# Patient Record
Sex: Female | Born: 1960 | Race: White | Hispanic: No | Marital: Married | Smoking: Never smoker
Health system: Southern US, Community
[De-identification: ages and names within clinical notes are randomized; demographics above are authoritative.]

## PROBLEM LIST (undated history)

## (undated) HISTORY — PX: NO PAST SURGERIES: SHX2092

---

## 2004-08-17 ENCOUNTER — Emergency Department: Payer: Self-pay | Admitting: Emergency Medicine

## 2008-03-24 ENCOUNTER — Ambulatory Visit: Payer: Self-pay | Admitting: Family Medicine

## 2014-05-20 ENCOUNTER — Other Ambulatory Visit: Payer: Self-pay | Admitting: Family Medicine

## 2014-05-20 DIAGNOSIS — Z1239 Encounter for other screening for malignant neoplasm of breast: Secondary | ICD-10-CM

## 2014-05-29 ENCOUNTER — Ambulatory Visit: Admission: RE | Admit: 2014-05-29 | Payer: Self-pay | Source: Ambulatory Visit

## 2014-06-04 ENCOUNTER — Ambulatory Visit
Admission: RE | Admit: 2014-06-04 | Discharge: 2014-06-04 | Disposition: A | Discharge status: Home / Self Care | Payer: Medicaid Other | Source: Ambulatory Visit | Attending: Family Medicine | Admitting: Family Medicine

## 2014-06-04 DIAGNOSIS — Z1239 Encounter for other screening for malignant neoplasm of breast: Secondary | ICD-10-CM

## 2014-06-04 DIAGNOSIS — Z1231 Encounter for screening mammogram for malignant neoplasm of breast: Secondary | ICD-10-CM | POA: Insufficient documentation

## 2014-06-09 ENCOUNTER — Other Ambulatory Visit: Payer: Self-pay | Admitting: Family Medicine

## 2014-06-09 DIAGNOSIS — N6489 Other specified disorders of breast: Secondary | ICD-10-CM

## 2014-06-09 DIAGNOSIS — R928 Other abnormal and inconclusive findings on diagnostic imaging of breast: Secondary | ICD-10-CM

## 2014-06-09 DIAGNOSIS — N63 Unspecified lump in unspecified breast: Secondary | ICD-10-CM

## 2014-06-13 ENCOUNTER — Ambulatory Visit
Admission: RE | Admit: 2014-06-13 | Discharge: 2014-06-13 | Disposition: A | Discharge status: Home / Self Care | Payer: Medicaid Other | Source: Ambulatory Visit | Attending: Family Medicine | Admitting: Family Medicine

## 2014-06-13 DIAGNOSIS — N6489 Other specified disorders of breast: Secondary | ICD-10-CM | POA: Diagnosis present

## 2014-06-13 DIAGNOSIS — N63 Unspecified lump in unspecified breast: Secondary | ICD-10-CM

## 2014-06-13 DIAGNOSIS — R928 Other abnormal and inconclusive findings on diagnostic imaging of breast: Secondary | ICD-10-CM

## 2017-01-13 IMAGING — US US BREAST*L* LIMITED INC AXILLA
1 series · 4 of 4 positions shown · non-contrast
Comparison: None.

ADDENDUM:
Addendum by Dr. Dieula Dimas on 06/25/2014. Outside prior
mammograms from [facility]HOSP[facility] have been obtained and
there is no significant change in the oval circumscribed and partly
obscured mass in the left breast 2 o'clock location. Given probably
benign findings on recent examination and long-term interval
stability, return to screening mammography in 1 year is recommended
without need for specific further surveillance unless there is any
clinical change.

Recommendation: Screening mammogram in one year.(Code:Z3-Z-NEX)
BI-RADS category: 2: Benign.
CLINICAL DATA: Patient recalled from screening for left breast mass
and right breast asymmetry.
EXAM:
DIGITAL DIAGNOSTIC BILATERAL MAMMOGRAM WITH 3D TOMOSYNTHESIS WITH
CAD
ULTRASOUND BILATERAL BREAST

[Series 1: us breast*left* limited inc axilla · 0.08mm/px · 4 of 4 slices shown]
[im 1/4]
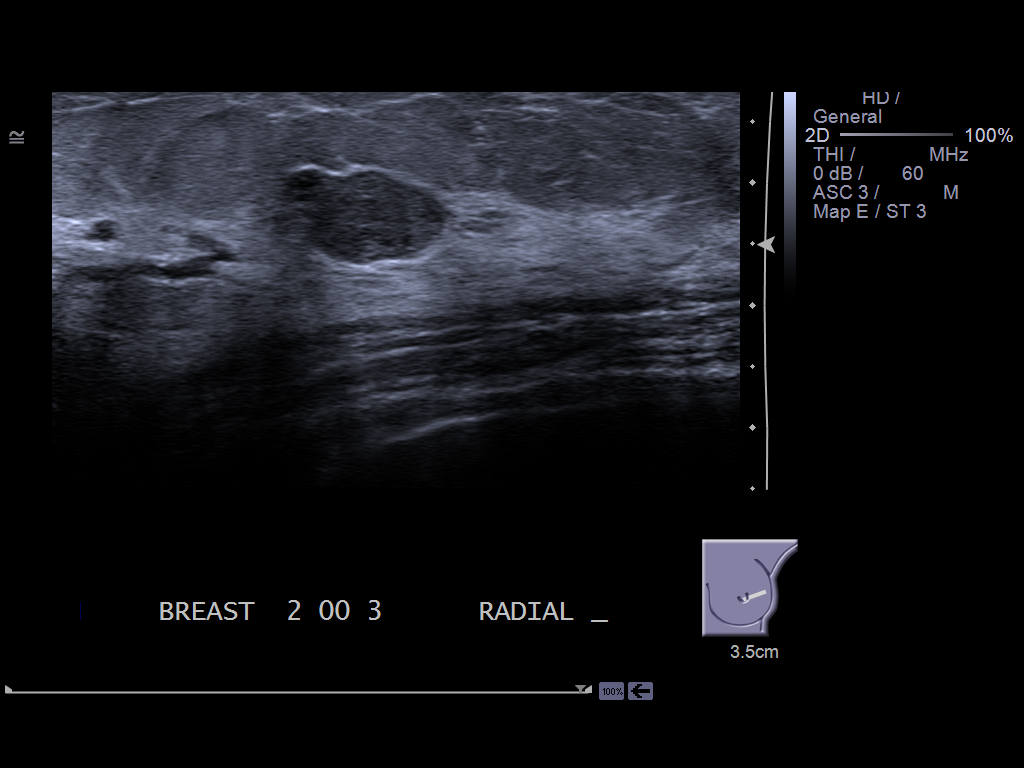
[im 2/4]
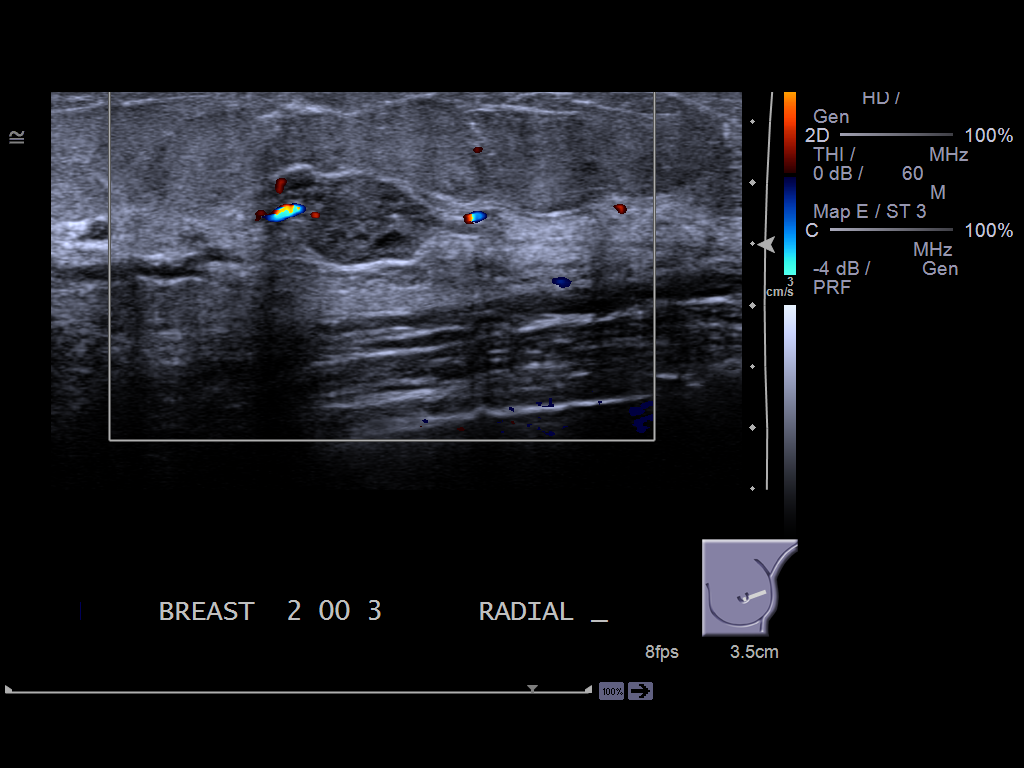
[im 3/4]
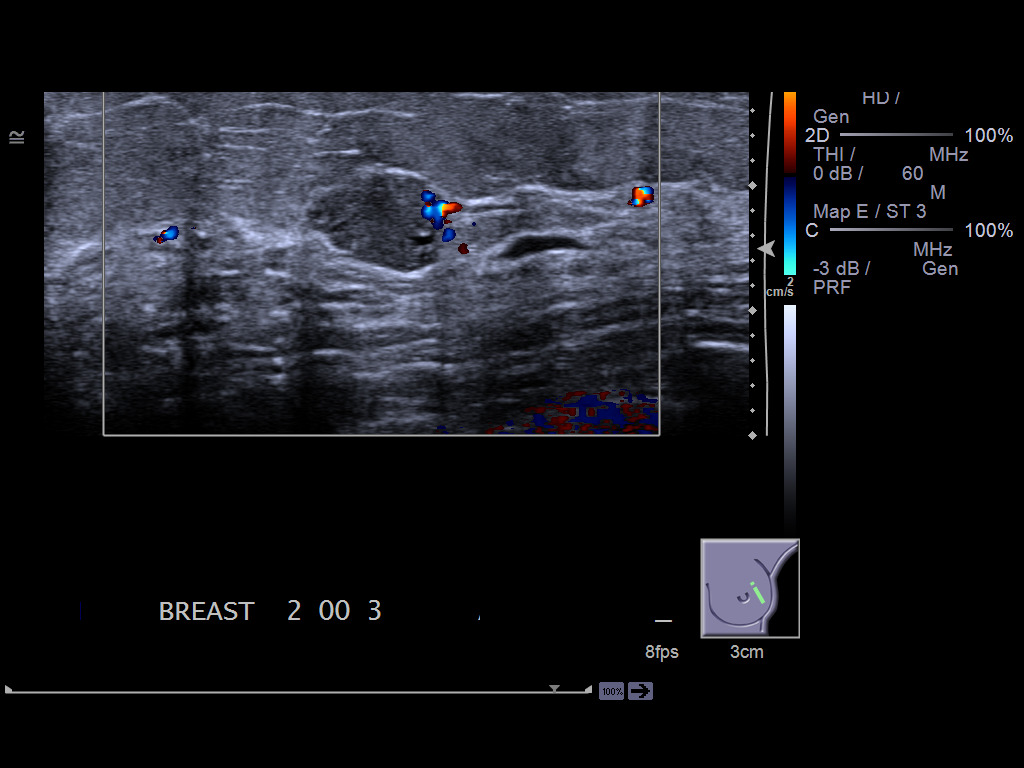
[im 4/4]
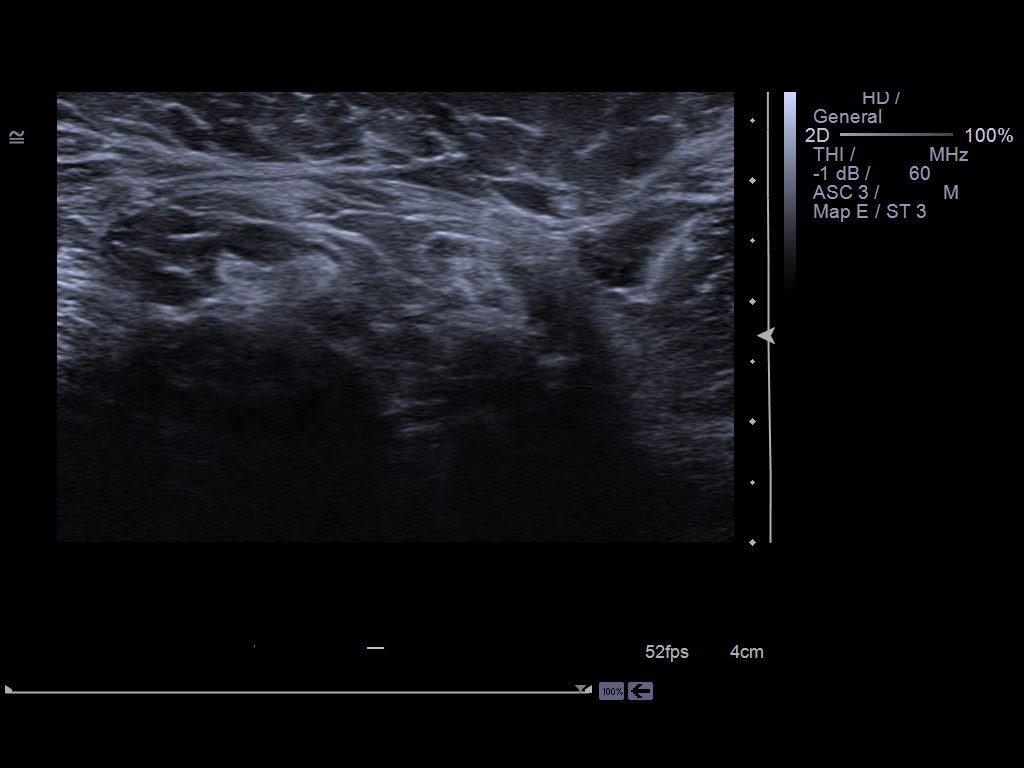

[4 of 4 positions shown; findings below may reference images not displayed]

ACR Breast Density Category c: The breast tissue is heterogeneously
dense, which may obscure small masses.
FINDINGS: Within the upper-outer left breast middle depth there is a
persistent oval circumscribed 1.4 cm mass further evaluated with
tomosynthesis images. Questioned asymmetry within the inferior
aspect of the right breast predominately resolved, most suggestive
of dense fibroglandular breast tissue.

Mammographic images were processed with CAD.

On physical exam, I palpate no discrete mass within the upper-outer
left breast. I palpate no discrete mass within the inferior right
breast.

Targeted ultrasound is performed, showing a 1.6 x 0.7 x 1.3 cm oval
circumscribed hypoechoic mass within left breast 2 o'clock position
3 cm from the nipple. No left axillary lymphadenopathy.

Within the inferior right breast near the 6 o'clock position dense
tissue is visualized without suspicious mass.
IMPRESSION: Hypoechoic left breast mass, potentially representing a
fibroadenoma. Malignancy cannot be excluded.

RECOMMENDATION:
Patient reports prior mammogram evaluation in [place]. An attempt
will be made to obtain these prior mammograms as this will allow for
determination of stability or interval change of the left breast
mass. If the priors are obtained, they will be compared with current
evaluation and a recommendation will be made.

If no prior evaluations are able to be obtained in the next 2 weeks
of the left breast, ultrasound-guided core needle biopsy of the left
breast mass is recommended. This was discussed with the patient.

I have discussed the findings and recommendations with the patient.
Results were also provided in writing at the conclusion of the
visit. If applicable, a reminder letter will be sent to the patient
regarding the next appointment.

BI-RADS CATEGORY  0: Incomplete. Need additional imaging evaluation
and/or prior mammograms for comparison.

## 2018-01-01 ENCOUNTER — Ambulatory Visit
Admission: EM | Admit: 2018-01-01 | Discharge: 2018-01-01 | Disposition: A | Discharge status: Home / Self Care | Payer: Self-pay | Attending: Family Medicine | Admitting: Family Medicine

## 2018-01-01 ENCOUNTER — Encounter: Payer: Self-pay | Admitting: Emergency Medicine

## 2018-01-01 ENCOUNTER — Other Ambulatory Visit: Payer: Self-pay

## 2018-01-01 DIAGNOSIS — R05 Cough: Secondary | ICD-10-CM

## 2018-01-01 DIAGNOSIS — J111 Influenza due to unidentified influenza virus with other respiratory manifestations: Secondary | ICD-10-CM

## 2018-01-01 DIAGNOSIS — R059 Cough, unspecified: Secondary | ICD-10-CM

## 2018-01-01 LAB — RAPID INFLUENZA A&B ANTIGENS (ARMC ONLY)
INFLUENZA A (ARMC): POSITIVE — AB
INFLUENZA B (ARMC): NEGATIVE

## 2018-01-01 MED ORDER — PROMETHAZINE-DM 6.25-15 MG/5ML PO SYRP: 5.0000 mL | ORAL_SOLUTION | Freq: Four times a day (QID) | ORAL | 0 refills | Status: AC | PRN

## 2018-01-01 NOTE — ED Provider Notes (Signed)
[facility]    CSN: [id number] Arrival date & time: 01/01/18  1509     History   Chief Complaint Chief Complaint  Patient presents with  . Generalized Body Aches  . Cough    HPI [name] is a 57 y.o. female. Patient presents with cough, fatigue and body aches x 5 days. Admits to temps up to 103 degrees at onset. Has not measured a temp in 3 days. Patient has been taking Motrin and Tylenol. States she feels better today than she did a few days ago. Denies any other concerns today.  HPI  History reviewed. No pertinent past medical history.  There are no active problems to display for this patient.   Past Surgical History:  Procedure Laterality Date  . NO PAST SURGERIES      OB History   No obstetric history on file.      Home Medications    Prior to Admission medications   Medication Sig Start Date End Date Taking? Authorizing Provider  promethazine-dextromethorphan (PROMETHAZINE-DM) 6.25-15 MG/5ML syrup Take 5 mLs by mouth 4 (four) times daily as needed for up to 7 days for cough. 01/01/18 01/08/18  Shirlee LatchEaves, Lakecia Deschamps B, PA-C    Family History Family History  Problem Relation Age of Onset  . Cancer Mother   . Dementia Father     Social History Social History   Tobacco Use  . Smoking status: Never Smoker  . Smokeless tobacco: Never Used  Substance Use Topics  . Alcohol use: Never    Frequency: Never  . Drug use: Never     Allergies   Patient has no known allergies.   Review of Systems Review of Systems  Constitutional: Positive for chills, fatigue and fever.  HENT: Positive for congestion and rhinorrhea. Negative for ear pain, sinus pressure, sinus pain and sore throat.   Respiratory: Positive for cough. Negative for chest tightness and shortness of breath.   Cardiovascular: Negative for chest pain.  Gastrointestinal: Negative for abdominal pain, nausea and vomiting.  Musculoskeletal: Positive for arthralgias and myalgias.  Skin:  Negative for color change and rash.  Neurological: Positive for headaches. Negative for dizziness, weakness and light-headedness.  Hematological: Positive for adenopathy.     Physical Exam Triage Vital Signs ED Triage Vitals  Enc Vitals Group     BP 01/01/18 1706 (!) 153/81     Pulse Rate 01/01/18 1706 78     Resp 01/01/18 1706 18     Temp 01/01/18 1706 98.5 F (36.9 C)     Temp Source 01/01/18 1706 Oral     SpO2 01/01/18 1706 100 %     Weight 01/01/18 1703 136 lb (61.7 kg)     Height 01/01/18 1703 5\' 2"  (1.575 m)     Head Circumference --      Peak Flow --      Pain Score 01/01/18 1703 0     Pain Loc --      Pain Edu? --      Excl. in GC? --    No data found.  Updated Vital Signs BP (!) 153/81 (BP Location: Left Arm)   Pulse 78   Temp 98.5 F (36.9 C) (Oral)   Resp 18   Ht 5\' 2"  (1.575 m)   Wt 136 lb (61.7 kg)   SpO2 100%   BMI 24.87 kg/m      Physical Exam Vitals signs and nursing note reviewed.  Constitutional:      General: She  is not in acute distress.    Appearance: Normal appearance. She is obese. She is ill-appearing. She is not toxic-appearing or diaphoretic.  HENT:     Head: Normocephalic and atraumatic.     Right Ear: Tympanic membrane, ear canal and external ear normal.     Left Ear: Tympanic membrane, ear canal and external ear normal.     Nose: Congestion and rhinorrhea (trace clear drainage) present.     Mouth/Throat:     Mouth: Mucous membranes are moist.     Pharynx: No oropharyngeal exudate.  Eyes:     General: No scleral icterus.       Right eye: No discharge.     Conjunctiva/sclera: Conjunctivae normal.  Neck:     Musculoskeletal: Neck supple. No muscular tenderness.  Cardiovascular:     Rate and Rhythm: Normal rate and regular rhythm.     Heart sounds: Normal heart sounds.  Pulmonary:     Effort: Pulmonary effort is normal. No respiratory distress.     Breath sounds: Normal breath sounds. No wheezing, rhonchi or rales.    Lymphadenopathy:     Cervical: Cervical adenopathy present.  Skin:    General: Skin is warm and dry.     Findings: No erythema or rash.  Neurological:     General: No focal deficit present.     Mental Status: She is alert and oriented to person, place, and time.     Gait: Gait normal.  Psychiatric:        Mood and Affect: Mood normal.        Behavior: Behavior normal.        Thought Content: Thought content normal.      UC Treatments / Results  Labs (all labs ordered are listed, but only abnormal results are displayed) Labs Reviewed  RAPID INFLUENZA A&B ANTIGENS ([facility] ONLY) - Abnormal; Notable for the following components:      Result Value   Influenza A ([facility]) POSITIVE (*)    All other components within normal limits    EKG None  Radiology No results found.  Procedures Procedures (including critical care time)  Medications Ordered in UC Medications - No data to display  Initial Impression / Assessment and Plan / UC Course  I have reviewed the triage vital signs and the nursing notes.  Pertinent labs & imaging results that were available during my care of the patient were reviewed by me and considered in my medical decision making (see chart for details).    Final Clinical Impressions(s) / UC Diagnoses   Final diagnoses:  Influenza  Cough     Discharge Instructions     FLU: Your flu test was positive today. You are out of the window for treatment with Tamiflu which is most helpful if started within the first 48 hours. Your fever has resolved and you appear to be on the mend at this time. Stressed the need to increase rest and fluid/electrolyte intake . Use medications as directed including cough medications. If breathing becomes a concern follow back up with [place] or go to the ER. Avoid others and do not return to work/school too soon in order to prevent others from becoming infected. Most get better within 1-2 wks. Call PCP if trouble breathing or are short of  breath, feel pain or pressure in your chest or abdomen, get dizzy, feel confused, or have vomiting .     ED Prescriptions    Medication Sig Dispense Auth. Provider   promethazine-dextromethorphan (PROMETHAZINE-DM)  6.25-15 MG/5ML syrup Take 5 mLs by mouth 4 (four) times daily as needed for up to 7 days for cough. 118 mL Eusebio FriendlyEaves, Tarsha Blando B, PA-C     Controlled Substance Prescriptions [place] Controlled Substance Registry consulted? Not Applicable   Gareth Morganaves, Imara Standiford B, PA-C 01/03/18 2001

## 2018-01-01 NOTE — ED Triage Notes (Signed)
Pt c/o cough, body aches, chills, fever and sweats. Started 5 days ago.

## 2018-01-01 NOTE — Discharge Instructions (Signed)
FLU: Your flu test was positive today. You are out of the window for treatment with Tamiflu which is most helpful if started within the first 48 hours. Your fever has resolved and you appear to be on the mend at this time. Stressed the need to increase rest and fluid/electrolyte intake . Use medications as directed including cough medications. If breathing becomes a concern follow back up with us or go to the ER. Avoid others and do not return to work/school too soon in order to prevent others from becoming infected. Most get better within 1-2 wks. Call PCP if trouble breathing or are short of breath, feel pain or pressure in your chest or abdomen, get dizzy, feel confused, or have vomiting .

## 2020-09-09 ENCOUNTER — Ambulatory Visit: Payer: Medicaid Other | Attending: Oncology

## 2021-12-01 ENCOUNTER — Encounter: Payer: Self-pay | Admitting: Family Medicine

## 2021-12-29 ENCOUNTER — Other Ambulatory Visit: Payer: Self-pay

## 2021-12-29 DIAGNOSIS — Z1231 Encounter for screening mammogram for malignant neoplasm of breast: Secondary | ICD-10-CM

## 2022-02-01 ENCOUNTER — Ambulatory Visit
Admission: RE | Admit: 2022-02-01 | Discharge: 2022-02-01 | Disposition: A | Discharge status: Home / Self Care | Payer: Medicaid Other | Source: Ambulatory Visit | Attending: Obstetrics and Gynecology | Admitting: Obstetrics and Gynecology

## 2022-02-01 ENCOUNTER — Ambulatory Visit: Payer: Medicaid Other | Attending: Hematology and Oncology | Admitting: Hematology and Oncology

## 2022-02-01 VITALS — BP 136/63 | Wt 122.3 lb

## 2022-02-01 DIAGNOSIS — Z1231 Encounter for screening mammogram for malignant neoplasm of breast: Secondary | ICD-10-CM | POA: Insufficient documentation

## 2022-02-01 NOTE — Progress Notes (Signed)
Ms. Kari Murray is a 62 y.o. female who presents to [facility] clinic today with no complaints.    Pap Smear: Pap not smear completed today. Last Pap smear was 2022 at [facility] clinic and was normal. Per patient has no history of an abnormal Pap smear. Last Pap smear result is available in Epic.   Physical exam: Breasts Breasts symmetrical. No skin abnormalities bilateral breasts. No nipple retraction bilateral breasts. No nipple discharge bilateral breasts. No lymphadenopathy. No lumps palpated bilateral breasts.       Pelvic/Bimanual Pap is not indicated today    Smoking History: Patient has never smoked and was not referred to quit line.    Patient Navigation: Patient education provided. Access to services provided for patient through [facility] program. No interpreter provided. No transportation provided   Colorectal Cancer Screening: Per patient has never had colonoscopy completed No complaints today. FIT test negative per Princella Ion.   Breast and Cervical Cancer Risk Assessment: Patient does not have family history of breast cancer, known genetic mutations, or radiation treatment to the chest before age 6. Patient does not have history of cervical dysplasia, immunocompromised, or DES exposure in-utero.  Risk Assessment   No risk assessment data     A: [facility] exam without pap smear No complaint with benign exam.  P: [facility] a [facility]. Appointment scheduled 02/01/22.  Dayton Scrape A, NP 02/01/2022 1:30 PM

## 2022-02-01 NOTE — Patient Instructions (Signed)
Taught Dorthula Nettles about breast self awareness and gave educational materials to take home. Patient did not need a Pap smear today due to last Pap smear was in 2022 per patient.  Let her know [facility] will cover Pap smears every 5 years unless has a history of abnormal Pap smears. [facility]erred [facility] to the [facility] for screening mamm[facility]. Appointment scheduled for 02/01/22. Patient aware of appointment and will be there. Let patient know will follow up with her within the next couple weeks with results. Mercy Moore Lukic verbalized understanding.  Melodye Ped, NP 1:31 PM

## 2023-01-25 LAB — COLOGUARD: COLOGUARD: NEGATIVE

## 2023-09-21 ENCOUNTER — Ambulatory Visit: Payer: Self-pay
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
## 2023-10-03 ENCOUNTER — Encounter: Payer: Self-pay | Admitting: Family Medicine

## 2023-10-03 ENCOUNTER — Ambulatory Visit (INDEPENDENT_AMBULATORY_CARE_PROVIDER_SITE_OTHER): Payer: Self-pay | Admitting: Family Medicine

## 2023-10-03 VITALS — BP 130/83 | HR 70 | Temp 98.3°F | Resp 18 | Ht 62.0 in | Wt 82.8 lb

## 2023-10-03 DIAGNOSIS — R413 Other amnesia: Secondary | ICD-10-CM | POA: Diagnosis not present

## 2023-10-03 DIAGNOSIS — I10 Essential (primary) hypertension: Secondary | ICD-10-CM | POA: Insufficient documentation

## 2023-10-03 DIAGNOSIS — F339 Major depressive disorder, recurrent, unspecified: Secondary | ICD-10-CM | POA: Diagnosis not present

## 2023-10-03 DIAGNOSIS — R636 Underweight: Secondary | ICD-10-CM

## 2023-10-03 DIAGNOSIS — Z681 Body mass index (BMI) 19 or less, adult: Secondary | ICD-10-CM | POA: Insufficient documentation

## 2023-10-03 NOTE — Assessment & Plan Note (Signed)
 BMI is 15+

## 2023-10-03 NOTE — Assessment & Plan Note (Signed)
 Will rule out the reversible causes of memory difficulties and check a brain MRI w and wo contrast
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
## 2023-10-03 NOTE — Assessment & Plan Note (Signed)
 BP well controlled on lisinopril-hydrochlorothiazide 20-12.5.

## 2023-10-03 NOTE — Assessment & Plan Note (Signed)
 She is on Zoloft 50mg  daily but this is out now.

## 2023-10-04 ENCOUNTER — Ambulatory Visit: Payer: Self-pay | Admitting: Family Medicine

## 2023-10-04 ENCOUNTER — Telehealth: Payer: Self-pay | Admitting: Family Medicine

## 2023-10-04 ENCOUNTER — Other Ambulatory Visit: Payer: Self-pay | Admitting: Family Medicine

## 2023-10-04 DIAGNOSIS — D509 Iron deficiency anemia, unspecified: Secondary | ICD-10-CM

## 2023-10-04 LAB — CBC WITH DIFFERENTIAL/PLATELET
Basophils Absolute: 0.1 x10E3/uL (ref 0.0–0.2)
Basos: 1 %
EOS (ABSOLUTE): 0.1 x10E3/uL (ref 0.0–0.4)
Eos: 1 %
Hematocrit: 32.7 % — ABNORMAL LOW (ref 34.0–46.6)
Hemoglobin: 10.1 g/dL — ABNORMAL LOW (ref 11.1–15.9)
Immature Grans (Abs): 0 x10E3/uL (ref 0.0–0.1)
Immature Granulocytes: 0 %
Lymphocytes Absolute: 1.3 x10E3/uL (ref 0.7–3.1)
Lymphs: 19 %
MCH: 25.9 pg — ABNORMAL LOW (ref 26.6–33.0)
MCHC: 30.9 g/dL — ABNORMAL LOW (ref 31.5–35.7)
MCV: 84 fL (ref 79–97)
Monocytes Absolute: 0.6 x10E3/uL (ref 0.1–0.9)
Monocytes: 10 %
Neutrophils Absolute: 4.6 x10E3/uL (ref 1.4–7.0)
Neutrophils: 69 %
Platelets: 321 x10E3/uL (ref 150–450)
RBC: 3.9 x10E6/uL (ref 3.77–5.28)
RDW: 13 % (ref 11.7–15.4)
WBC: 6.7 x10E3/uL (ref 3.4–10.8)

## 2023-10-04 LAB — CMP14+EGFR
ALT: 16 IU/L (ref 0–32)
AST: 22 IU/L (ref 0–40)
Albumin: 4.7 g/dL (ref 3.9–4.9)
Alkaline Phosphatase: 68 IU/L (ref 49–135)
BUN/Creatinine Ratio: 25 (ref 12–28)
BUN: 23 mg/dL (ref 8–27)
Bilirubin Total: 0.3 mg/dL (ref 0.0–1.2)
CO2: 24 mmol/L (ref 20–29)
Calcium: 10.2 mg/dL (ref 8.7–10.3)
Chloride: 101 mmol/L (ref 96–106)
Creatinine, Ser: 0.93 mg/dL (ref 0.57–1.00)
Globulin, Total: 2.8 g/dL (ref 1.5–4.5)
Glucose: 83 mg/dL (ref 70–99)
Potassium: 4.8 mmol/L (ref 3.5–5.2)
Sodium: 140 mmol/L (ref 134–144)
Total Protein: 7.5 g/dL (ref 6.0–8.5)
eGFR: 68 mL/min/1.73 (ref >59)

## 2023-10-04 LAB — VITAMIN B12: Vitamin B-12: 588 pg/mL (ref 232–1245)

## 2023-10-04 LAB — RPR: RPR Ser Ql: NONREACTIVE

## 2023-10-04 LAB — TSH+FREE T4
Free T4: 1.21 ng/dL (ref 0.82–1.77)
TSH: 1.31 u[IU]/mL (ref 0.450–4.500)

## 2023-10-04 NOTE — Telephone Encounter (Signed)
 error

## 2023-10-17 ENCOUNTER — Telehealth: Payer: Self-pay

## 2023-10-17 ENCOUNTER — Other Ambulatory Visit: Payer: Self-pay

## 2023-10-17 ENCOUNTER — Ambulatory Visit: Admitting: Family Medicine

## 2023-10-17 ENCOUNTER — Encounter: Payer: Self-pay | Admitting: Family Medicine

## 2023-10-17 VITALS — BP 113/64 | HR 72 | Temp 98.2°F | Resp 18 | Ht 62.0 in | Wt 90.8 lb

## 2023-10-17 DIAGNOSIS — F325 Major depressive disorder, single episode, in full remission: Secondary | ICD-10-CM | POA: Diagnosis not present

## 2023-10-17 DIAGNOSIS — D509 Iron deficiency anemia, unspecified: Secondary | ICD-10-CM | POA: Insufficient documentation

## 2023-10-17 DIAGNOSIS — F03B0 Unspecified dementia, moderate, without behavioral disturbance, psychotic disturbance, mood disturbance, and anxiety: Secondary | ICD-10-CM | POA: Insufficient documentation

## 2023-10-17 DIAGNOSIS — Z23 Encounter for immunization: Secondary | ICD-10-CM | POA: Diagnosis not present

## 2023-10-17 DIAGNOSIS — I10 Essential (primary) hypertension: Secondary | ICD-10-CM | POA: Diagnosis not present

## 2023-10-17 DIAGNOSIS — D508 Other iron deficiency anemias: Secondary | ICD-10-CM

## 2023-10-17 DIAGNOSIS — F03B18 Unspecified dementia, moderate, with other behavioral disturbance: Secondary | ICD-10-CM

## 2023-10-17 DIAGNOSIS — F0390 Unspecified dementia without behavioral disturbance: Secondary | ICD-10-CM | POA: Insufficient documentation

## 2023-10-17 MED ORDER — LISINOPRIL-HYDROCHLOROTHIAZIDE 10-12.5 MG PO TABS: 1.0000 | ORAL_TABLET | Freq: Every day | ORAL | 1 refills | Status: DC

## 2023-10-17 MED ORDER — DONEPEZIL HCL 5 MG PO TABS: 5.0000 mg | ORAL_TABLET | Freq: Every day | ORAL | 1 refills | Status: DC

## 2023-10-17 MED ORDER — SERTRALINE HCL 50 MG PO TABS: 50.0000 mg | ORAL_TABLET | Freq: Every day | ORAL | 1 refills | Status: DC

## 2023-10-17 MED ORDER — LISINOPRIL-HYDROCHLOROTHIAZIDE 10-12.5 MG PO TABS: 1.0000 | ORAL_TABLET | Freq: Every day | ORAL | 3 refills | Status: AC

## 2023-10-17 MED ORDER — LISINOPRIL-HYDROCHLOROTHIAZIDE 10-12.5 MG PO TABS: 1.0000 | ORAL_TABLET | Freq: Every day | ORAL | 3 refills | Status: DC

## 2023-10-17 MED ORDER — SERTRALINE HCL 50 MG PO TABS: 50.0000 mg | ORAL_TABLET | Freq: Every day | ORAL | 3 refills | Status: DC

## 2023-10-17 NOTE — Telephone Encounter (Signed)
 Please send script to corrected pharmacy

## 2023-10-17 NOTE — Assessment & Plan Note (Signed)
 Hemoglobin is 10.1.  Will check iron indices today.

## 2023-10-17 NOTE — Assessment & Plan Note (Addendum)
 Has a CT scan of her brain Thursday.  Will start Aricept 5 mg nightly and follow-up in a month.  If possible will start Namenda in a month.  Discussed keeping the house locked so that she cannot get out and wander.

## 2023-10-17 NOTE — Telephone Encounter (Signed)
 Copied from CRM [id number]. Topic: Clinical - Prescription Issue >> Oct 17, 2023 12:25 PM Delon DASEN wrote: Reason for CRM: Patient needs her medications sent to the [facility] health department in [place] on [address] not the [employer]. Want to pick it up today.

## 2023-10-17 NOTE — Addendum Note (Signed)
 Addended by: Magdaleno Lortie K on: 10/17/2023 02:20 PM   Modules accepted: Orders
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
## 2023-10-18 LAB — IRON,TIBC AND FERRITIN PANEL
Ferritin: 160 ng/mL — ABNORMAL HIGH (ref 15–150)
Iron Saturation: 12 % — ABNORMAL LOW (ref 15–55)
Iron: 34 ug/dL (ref 27–139)
Total Iron Binding Capacity: 295 ug/dL (ref 250–450)
UIBC: 261 ug/dL (ref 118–369)

## 2023-10-19 ENCOUNTER — Ambulatory Visit: Payer: Self-pay | Admitting: Family Medicine

## 2023-10-19 ENCOUNTER — Ambulatory Visit
Admission: RE | Admit: 2023-10-19 | Discharge: 2023-10-19 | Disposition: A | Discharge status: Home / Self Care | Source: Ambulatory Visit | Attending: Family Medicine | Admitting: Family Medicine

## 2023-10-19 ENCOUNTER — Telehealth: Payer: Self-pay | Admitting: Family Medicine

## 2023-10-19 DIAGNOSIS — R413 Other amnesia: Secondary | ICD-10-CM | POA: Insufficient documentation

## 2023-10-19 MED ORDER — GADOBUTROL 1 MMOL/ML IV SOLN
4.0000 mL | Freq: Once | INTRAVENOUS | Status: AC | PRN
  Administered 2023-10-19: 4 mL via INTRAVENOUS

## 2023-10-19 NOTE — Telephone Encounter (Signed)
 Pt. walked in requesting lab results.  Please call patient to give lab results  Last OV was 10/17/2023.

## 2023-10-19 NOTE — Telephone Encounter (Signed)
 Called [name] Goldin and left a message stating that I wanted to talk to her about Donta Westhoff's blood work.

## 2023-10-20 NOTE — Telephone Encounter (Signed)
LVM requesting return call regarding lab results.

## 2023-10-23 ENCOUNTER — Telehealth: Payer: Self-pay

## 2023-10-23 NOTE — Telephone Encounter (Signed)
 Copied from CRM #[id number]. Topic: Clinical - Lab/Test Results >> Oct 23, 2023  1:20 PM Nathanel BROCKS wrote: Reason for CRM: pt is requesting information on mri.

## 2023-10-24 ENCOUNTER — Telehealth: Payer: Self-pay | Admitting: Family Medicine

## 2023-10-24 ENCOUNTER — Telehealth: Payer: Self-pay

## 2023-10-24 NOTE — Telephone Encounter (Signed)
 LM that I was trying to reach her about her brain MRI results.

## 2023-10-24 NOTE — Telephone Encounter (Signed)
 Reviewed CT scan of the brain results with her.  Simple aging only.

## 2023-10-24 NOTE — Telephone Encounter (Signed)
 Copied from CRM #[id number]. Topic: Clinical - Lab/Test Results >> Oct 24, 2023  8:33 AM Kari Murray wrote: Reason for CRM: rose golden called on behalf of pt about the MRI brain results please follow back up regarding that

## 2023-10-24 NOTE — Telephone Encounter (Signed)
 LM that I was trying to reach her about Earlean's brain MRI results

## 2023-10-26 LAB — IRON,TIBC AND FERRITIN PANEL
Ferritin: 270 ng/mL — ABNORMAL HIGH (ref 15–150)
Iron Saturation: 21 % (ref 15–55)
Iron: 63 ug/dL (ref 27–139)
Total Iron Binding Capacity: 298 ug/dL (ref 250–450)
UIBC: 235 ug/dL (ref 118–369)

## 2023-10-26 LAB — SPECIMEN STATUS REPORT

## 2023-10-31 ENCOUNTER — Ambulatory Visit: Admitting: Family Medicine

## 2023-11-17 ENCOUNTER — Ambulatory Visit: Admitting: Family Medicine

## 2023-11-21 ENCOUNTER — Ambulatory Visit: Admitting: Family Medicine

## 2023-11-21 ENCOUNTER — Encounter: Payer: Self-pay | Admitting: Family Medicine

## 2023-11-21 VITALS — BP 133/78 | HR 66 | Temp 98.1°F | Resp 18 | Ht 62.0 in | Wt 91.0 lb

## 2023-11-21 DIAGNOSIS — G300 Alzheimer's disease with early onset: Secondary | ICD-10-CM

## 2023-11-21 DIAGNOSIS — F03B0 Unspecified dementia, moderate, without behavioral disturbance, psychotic disturbance, mood disturbance, and anxiety: Secondary | ICD-10-CM | POA: Diagnosis not present

## 2023-11-21 MED ORDER — MEMANTINE HCL 28 X 5 MG & 21 X 10 MG PO TABS: ORAL_TABLET | ORAL | 12 refills | Status: DC

## 2023-11-21 NOTE — Progress Notes (Signed)
 Established Patient Office Visit  Subjective   Patient ID: Kari Murray, female    DOB: September 28, 1960  Age: 63 y.o. MRN: [id number]  Chief Complaint  Patient presents with   Medical Management of Chronic Issues    HPI Delightful 63 year old female with memory problems and sundowning behavior.  She is here with her friend Kari Murray with whom she lives.  [name] reports that she gets up in the middle of the night and sits and watches television.  She has not tried to elope during the night and she does not talk about leaving.  She does order quite a few things off to move with her phone.  Talk to her about not purchasing things.    We started Aricept 5 mg at last visit.  [name] has not noticed an improvement but also has not noticed a decline.  Kari Murray thinks that she is thinking better.  She is still working at [employer] as a conservation officer, nature and so far there have not been any serious problems with her doing this job.    Discussed adding Namenda to her Aricept          Objective:     BP 133/78 (BP Location: Right Arm, Patient Position: Sitting, Cuff Size: Normal)   Pulse 66   Temp 98.1 F (36.7 C) (Oral)   Resp 18   Ht 5' 2 (1.575 m)   Wt 91 lb (41.3 kg)   SpO2 99%   BMI 16.64 kg/m    Physical Exam Vitals and nursing note reviewed.  Constitutional:      Appearance: Normal appearance.  HENT:     Head: Normocephalic and atraumatic.  Eyes:     Conjunctiva/sclera: Conjunctivae normal.  Cardiovascular:     Rate and Rhythm: Normal rate and regular rhythm.  Pulmonary:     Effort: Pulmonary effort is normal.     Breath sounds: Normal breath sounds.  Musculoskeletal:     Right lower leg: No edema.     Left lower leg: No edema.  Skin:    General: Skin is warm and dry.  Neurological:     Mental Status: She is alert and oriented to person, place, and time.  Psychiatric:        Mood and Affect: Mood normal.        Behavior: Behavior normal.        Thought Content: Thought content normal.         Judgment: Judgment normal.          No results found for any visits on 11/21/23.    The ASCVD Risk score (Arnett DK, et al., 2019) failed to calculate for the following reasons:   Cannot find a previous HDL lab   Cannot find a previous total cholesterol lab    Assessment & Plan:   Moderate dementia without behavioral disturbance, psychotic disturbance, mood disturbance, or anxiety, unspecified dementia type (HCC) Assessment & Plan: She is on Aricept 5 mg and tolerating it.  Gave her Namenda starting pack today.  Please take both the Aricept and the Namenda.  Did advise that occasionally Namenda can cause agitation.  Please follow-up in a month  Orders: -     Memantine HCl; 5 mg/day for =1 week; 5 mg twice daily for =1 week; 15 mg/day given in 5 mg and 10 mg separated doses for =1 week; then 10 mg twice daily  Dispense: 49 tablet; Refill: 12        Return in about 4  weeks (around 12/19/2023).    Kari Murray K Tamaj Jurgens, MD

## 2023-11-21 NOTE — Assessment & Plan Note (Signed)
 She is on Aricept 5 mg and tolerating it.  Gave her Namenda starting pack today.  Please take both the Aricept and the Namenda.  Did advise that occasionally Namenda can cause agitation.  Please follow-up in a month

## 2023-11-23 ENCOUNTER — Telehealth: Payer: Self-pay | Admitting: Family Medicine

## 2023-11-23 DIAGNOSIS — F03B0 Unspecified dementia, moderate, without behavioral disturbance, psychotic disturbance, mood disturbance, and anxiety: Secondary | ICD-10-CM

## 2023-11-23 NOTE — Telephone Encounter (Signed)
 Copied from CRM [id number]. Topic: Clinical - Prescription Issue >> Nov 23, 2023  3:31 PM Kari Murray wrote: Reason for CRM: Pt's friend, Kari Murray, said that [facility] is saying they never received pt's prescription for memantine (NAMENDA TITRATION PAK) tablet pack. The pharmacy closes at 1 PM tomorrow so it would be appreciated if it can be resent before then.

## 2023-11-23 NOTE — Telephone Encounter (Signed)
 Copied from CRM [id number]. Topic: Clinical - Prescription Issue >> Nov 23, 2023  3:49 PM Winona R wrote: Pt friend calling to request memantine  (NAMENDA  TITRATION PAK) tablet pack be sent to [facility] - [place], [place] - [address] [place] [address] Phone: [phone] Fax: [phone]  As they have not received please also fee free to call in a verbal order to expedite this refill. Pharmacy closes at 1pm tomorrow

## 2023-11-24 ENCOUNTER — Telehealth: Payer: Self-pay | Admitting: Family Medicine

## 2023-11-24 MED ORDER — MEMANTINE HCL 28 X 5 MG & 21 X 10 MG PO TABS
ORAL_TABLET | ORAL | 12 refills | Status: DC
Start: 2023-11-24 — End: 2023-11-28

## 2023-11-24 NOTE — Telephone Encounter (Signed)
 Called in prescription for Namenda  titration pack.

## 2023-11-24 NOTE — Telephone Encounter (Signed)
Prescription has been resent

## 2023-11-28 ENCOUNTER — Telehealth: Payer: Self-pay | Admitting: Family Medicine

## 2023-11-28 DIAGNOSIS — F03B0 Unspecified dementia, moderate, without behavioral disturbance, psychotic disturbance, mood disturbance, and anxiety: Secondary | ICD-10-CM

## 2023-11-28 MED ORDER — MEMANTINE HCL 28 X 5 MG & 21 X 10 MG PO TABS
ORAL_TABLET | ORAL | 12 refills | Status: DC
Start: 2023-11-28 — End: 2023-11-29

## 2023-11-28 NOTE — Telephone Encounter (Signed)
 Copied from CRM #[id number]. Topic: Clinical - Prescription Issue >> Nov 27, 2023 11:09 AM Tiffini S wrote: Reason for CRM: Patient called about a prescription issue for memantine  (NAMENDA  TITRATION PAK) tablet pack- per the pharmacy prescription haven't been received by the provider Called CAL, they called the pharmacy and the medication was called over to [facility] three times and is ready for pick up today Was told that pcp is not in the office today- if patient have additional issues to please call back while at the pharmacy location.   Patient will go to pharmacy location today at 1pm. >> Nov 27, 2023  6:02 PM Zebedee SAUNDERS wrote: Pt called stated pharmacy has not received medication pack refill script. But it show it sent E-Prescribing Status: Receipt confirmed by pharmacy (11/24/2023  8:40 AM EST) . Please call  [facility] - [place], [place] - [address]  [address] [place] [address]  Phone: [phone] Fax: [phone]

## 2023-11-28 NOTE — Telephone Encounter (Signed)
 Script has been resent to [facility].

## 2023-11-28 NOTE — Telephone Encounter (Signed)
 Copied from CRM #[id number]. Topic: Clinical - Prescription Issue >> Nov 27, 2023  2:34 PM Kari Murray wrote: Reason for CRM: Pt said [facility] does not have memantine  (NAMENDA  TITRATION PAK) tablet pack. She said they can take responsibility for it and it can be billed to them. Pt would like prescription sent to [facility] on [address] in [place].

## 2023-11-29 MED ORDER — MEMANTINE HCL 28 X 5 MG & 21 X 10 MG PO TABS: ORAL_TABLET | ORAL | 12 refills | Status: DC

## 2023-11-29 NOTE — Telephone Encounter (Signed)
 Copied from CRM #[id number]. Topic: Clinical - Prescription Issue >> Nov 27, 2023  2:34 PM Dedra B wrote: Reason for CRM: Pt said [facility] does not have memantine  (NAMENDA  TITRATION PAK) tablet pack. She said they can take responsibility for it and it can be billed to them. Pt would like prescription sent to [facility] on [address] in [place]. >> Nov 29, 2023  1:15 PM Montie POUR wrote: Please send order to fill to [facility], [place]. The other pharmacy was not able to fill medications. Please call [name] at [phone]. Thanks

## 2023-11-29 NOTE — Telephone Encounter (Signed)
 Pres[facility] [place].

## 2023-12-05 ENCOUNTER — Telehealth: Payer: Self-pay

## 2023-12-05 NOTE — Telephone Encounter (Signed)
 Discussed change of med w pharmacy and they will do 5 mg tabs to change and support dosing since titration is not available FOR Namenda  RX quantity 70. Kari Murray to dicuss with patient and pharmacy to advise instructions on taking it as the titration instructs.

## 2023-12-06 ENCOUNTER — Telehealth: Payer: Self-pay | Admitting: Family Medicine

## 2023-12-12 NOTE — Telephone Encounter (Signed)
 Note note found

## 2023-12-21 ENCOUNTER — Ambulatory Visit: Admitting: Family Medicine

## 2023-12-21 ENCOUNTER — Encounter: Payer: Self-pay | Admitting: Family Medicine

## 2023-12-21 VITALS — BP 135/60 | HR 74 | Ht 62.0 in | Wt 91.0 lb

## 2023-12-21 DIAGNOSIS — F03B18 Unspecified dementia, moderate, with other behavioral disturbance: Secondary | ICD-10-CM

## 2023-12-21 MED ORDER — RIVASTIGMINE 4.6 MG/24HR TD PT24: 4.6000 mg | MEDICATED_PATCH | Freq: Every day | TRANSDERMAL | 1 refills | Status: AC

## 2023-12-21 NOTE — Progress Notes (Signed)
° °  Established Patient Office Visit  Subjective   Patient ID: Kari Murray, female    DOB: 1960/03/16  Age: 63 y.o. MRN: [id number]  Chief Complaint  Patient presents with   Follow-up    Possible change in medication - patch? \ Patient states no problems with medications - on week 3 of MEMANTINE     HPI She is here with her friend, [name].  She has developed some unusual behavior.  She is voiding in the trash can at night.  No wandering, no aggression or agitation.  She is on week three of Namenda .  She is also taking Aricept .  [name] has not seen any improvement,  She is still working at [employer] as a conservation officer, nature.      Objective:     BP 135/60 (BP Location: Right Arm, Patient Position: Sitting, Cuff Size: Small)   Pulse 74   Ht 5' 2 (1.575 m)   Wt 91 lb (41.3 kg)   SpO2 98%   BMI 16.64 kg/m    Physical Exam Vitals reviewed.  Constitutional:      Appearance: Normal appearance.  HENT:     Head: Normocephalic.  Eyes:     General:        Right eye: No discharge.        Left eye: No discharge.  Cardiovascular:     Rate and Rhythm: Normal rate.  Pulmonary:     Effort: Pulmonary effort is normal.  Neurological:     Mental Status: She is alert and oriented to person, place, and time.  Psychiatric:        Mood and Affect: Mood normal.        Behavior: Behavior normal.        Thought Content: Thought content normal.        Judgment: Judgment normal.          No results found for any visits on 12/21/23.    The ASCVD Risk score (Arnett DK, et al., 2019) failed to calculate for the following reasons:   Cannot find a previous HDL lab   Cannot find a previous total cholesterol lab   * - Cholesterol units were assumed    Assessment & Plan:  Moderate dementia with other behavioral disturbance, unspecified dementia type [facility]) Assessment & Plan: Continue Namenda  on a taper dose and start rivastigmine  patch 4.6 mg daily and stop Aricept .  Follow-up in a month  please  Orders: -     Rivastigmine ; Place 1 patch (4.6 mg total) onto the skin daily.  Dispense: 30 patch; Refill: 1     Return in about 4 weeks (around 01/18/2024).    Haydin Calandra K Keelin Neville, MD

## 2023-12-21 NOTE — Assessment & Plan Note (Signed)
 Continue Namenda  on a taper dose and start rivastigmine  patch 4.6 mg daily and stop Aricept .  Follow-up in a month please

## 2024-01-05 ENCOUNTER — Telehealth: Payer: Self-pay | Admitting: Family Medicine

## 2024-01-05 NOTE — Telephone Encounter (Signed)
 Patient walked in requesting medication refill for Memantine  [facility] tablets.  Want to know if mg can be increased from 10 mg. Out of medication 01/04/2024. Please call [phone].

## 2024-01-08 ENCOUNTER — Other Ambulatory Visit: Payer: Self-pay | Admitting: Family Medicine

## 2024-01-08 MED ORDER — MEMANTINE HCL 10 MG PO TABS: 10.0000 mg | ORAL_TABLET | Freq: Two times a day (BID) | ORAL | 5 refills | Status: AC

## 2024-01-08 NOTE — Telephone Encounter (Signed)
 LVM with someone to tell pt that Rx has been sent in per the below message, and to call if she has any questions.    Ziglar, Susan K, MD to Me (Selected Message)     01/08/24  1:21 PM Advise that I sent in a prescription for Namenda  (memantine ) 10mg  BID in for her.  Thanks

## 2024-01-22 ENCOUNTER — Ambulatory Visit: Admitting: Family Medicine

## 2024-01-22 ENCOUNTER — Encounter: Payer: Self-pay | Admitting: Family Medicine

## 2024-01-22 VITALS — BP 137/84 | HR 76 | Ht 62.0 in | Wt 89.4 lb

## 2024-01-22 DIAGNOSIS — G300 Alzheimer's disease with early onset: Secondary | ICD-10-CM

## 2024-01-22 DIAGNOSIS — D229 Melanocytic nevi, unspecified: Secondary | ICD-10-CM | POA: Insufficient documentation

## 2024-01-22 DIAGNOSIS — F02B0 Dementia in other diseases classified elsewhere, moderate, without behavioral disturbance, psychotic disturbance, mood disturbance, and anxiety: Secondary | ICD-10-CM | POA: Diagnosis not present

## 2024-01-22 NOTE — Assessment & Plan Note (Signed)
 She is on the Exelon  patch 4.6 mg daily and Namenda  10 mg twice daily.  No significant change noted.  Will refer to neurology.  Discussed safety issues with her friend [name] today.

## 2024-01-22 NOTE — Assessment & Plan Note (Signed)
 Has multiple nevi on her back but a couple are very dark.  Referral to dermatology
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
## 2024-02-01 ENCOUNTER — Ambulatory Visit: Admitting: Dermatology

## 2024-02-01 ENCOUNTER — Encounter: Payer: Self-pay | Admitting: Dermatology

## 2024-02-01 DIAGNOSIS — W908XXA Exposure to other nonionizing radiation, initial encounter: Secondary | ICD-10-CM

## 2024-02-01 DIAGNOSIS — L578 Other skin changes due to chronic exposure to nonionizing radiation: Secondary | ICD-10-CM

## 2024-02-01 DIAGNOSIS — D039 Melanoma in situ, unspecified: Secondary | ICD-10-CM

## 2024-02-01 DIAGNOSIS — D485 Neoplasm of uncertain behavior of skin: Secondary | ICD-10-CM

## 2024-02-01 DIAGNOSIS — D0359 Melanoma in situ of other part of trunk: Secondary | ICD-10-CM

## 2024-02-01 DIAGNOSIS — D492 Neoplasm of unspecified behavior of bone, soft tissue, and skin: Secondary | ICD-10-CM

## 2024-02-01 DIAGNOSIS — L814 Other melanin hyperpigmentation: Secondary | ICD-10-CM | POA: Diagnosis not present

## 2024-02-01 DIAGNOSIS — D229 Melanocytic nevi, unspecified: Secondary | ICD-10-CM

## 2024-02-01 DIAGNOSIS — L821 Other seborrheic keratosis: Secondary | ICD-10-CM

## 2024-02-01 HISTORY — DX: Melanoma in situ, unspecified: D03.9

## 2024-02-01 NOTE — Patient Instructions (Addendum)

## 2024-02-01 NOTE — Progress Notes (Deleted)
" ° °  New Patient Visit   Subjective  Kari Murray is a 64 y.o. female who presents for the following: Skin Cancer Screening and Upper Body Skin Exam. No personal Hx of skin cancer. No Hx of dysplastic nevi. No family Hx of skin cancer.   The patient presents for Upper Body Skin Exam (UBSE) for skin cancer screening and mole check. The patient has spots, moles and lesions to be evaluated, some may be new or changing and the patient may have concern these could be cancer.    The following portions of the chart were reviewed this encounter and updated as appropriate: medications, allergies, medical history  Review of Systems:  No other skin or systemic complaints except as noted in HPI or Assessment and Plan.  Objective  Well appearing patient in no apparent distress; mood and affect are within normal limits.  All skin waist up examined. Relevant physical exam findings are noted in the Assessment and Plan.  Exam of nails limited by presence of nail polish.    Assessment & Plan    Skin cancer screening performed today.  Actinic Damage - Chronic condition, secondary to cumulative UV/sun exposure - diffuse scaly erythematous macules with underlying dyspigmentation - Recommend daily broad spectrum sunscreen SPF 30+ to sun-exposed areas, reapply every 2 hours as needed.  - Staying in the shade or wearing long sleeves, sun glasses (UVA+UVB protection) and wide brim hats (4-inch brim around the entire circumference of the hat) are also recommended for sun protection.  - Call for new or changing lesions.  Lentigines, Seborrheic Keratoses, Hemangiomas - Benign normal skin lesions - Benign-appearing - Call for any changes  Melanocytic Nevi - Tan-brown and/or pink-flesh-colored symmetric macules and papules - Benign appearing on exam today - Observation - Call clinic for new or changing moles - Recommend daily use of broad spectrum spf 30+ sunscreen to sun-exposed areas.  - Check nails  when remove polish.  No follow-ups on file.  I, Halden Phegley, CMA, am acting as scribe for Boneta Sharps, MD.   Documentation: I have reviewed the above documentation for accuracy and completeness, and I agree with the above.  Boneta Sharps, MD    "

## 2024-02-01 NOTE — Progress Notes (Signed)
" ° °  New Patient Visit   Subjective  Kari Murray is a 64 y.o. female who presents for the following: check moles on back. No personal Hx of skin cancer or dysplastic nevi. No family Hx of melanoma    The following portions of the chart were reviewed this encounter and updated as appropriate: medications, allergies, medical history  Review of Systems:  No other skin or systemic complaints except as noted in HPI or Assessment and Plan.  Objective  Well appearing patient in no apparent distress; mood and affect are within normal limits.   A focused examination was performed of the following areas: Back Patient deferred UBSE at this time.   Relevant exam findings are noted in the Assessment and Plan.  Left Upper Paraspinal Back 1 cm irregular black macule   Assessment & Plan    MELANOCYTIC NEVI Exam: Tan-brown and/or pink-flesh-colored symmetric macules and papules  Treatment Plan: Benign appearing on exam today. Recommend observation. Call clinic for new or changing moles. Recommend daily use of broad spectrum spf 30+ sunscreen to sun-exposed areas.   LENTIGINES Exam: scattered tan macules Due to sun exposure Treatment Plan: Benign-appearing, observe. Recommend daily broad spectrum sunscreen SPF 30+ to sun-exposed areas, reapply every 2 hours as needed.  Call for any changes  SEBORRHEIC KERATOSIS - Stuck-on, waxy, tan-brown papules and/or plaques  - Benign-appearing - Discussed benign etiology and prognosis. - Observe - Call for any changes   ACTINIC DAMAGE - chronic, secondary to cumulative UV radiation exposure/sun exposure over time - diffuse scaly erythematous macules with underlying dyspigmentation - Recommend daily broad spectrum sunscreen SPF 30+ to sun-exposed areas, reapply every 2 hours as needed.  - Recommend staying in the shade or wearing long sleeves, sun glasses (UVA+UVB protection) and wide brim hats (4-inch brim around the entire circumference of  the hat). - Call for new or changing lesions.  NEOPLASM OF SKIN Left Upper Paraspinal Back - Epidermal / dermal shaving  Lesion diameter (cm):  1 Informed consent: discussed and consent obtained   Timeout: patient name, date of birth, surgical site, and procedure verified   Procedure prep:  Patient was prepped and draped in usual sterile fashion Prep type:  Isopropyl alcohol Anesthesia: the lesion was anesthetized in a standard fashion   Anesthetic:  1% lidocaine w/ epinephrine 1-100,000 buffered w/ 8.4% NaHCO3 Instrument used: DermaBlade   Hemostasis achieved with: pressure and aluminum chloride   Outcome: patient tolerated procedure well   Post-procedure details: wound care instructions given    Specimen 1 - Surgical pathology Differential Diagnosis: dysplastic vs melanoma vs SK  Check Margins: No ACTINIC ELASTOSIS   LENTIGINES   MULTIPLE BENIGN NEVI   SEBORRHEIC KERATOSES    Return for follow up pending pathology results.Kari Murray Kate Mikhayla, CMA, am acting as scribe for Boneta Sharps, MD.   Documentation: I have reviewed the above documentation for accuracy and completeness, and I agree with the above.  Boneta Sharps, MD    "

## 2024-02-06 ENCOUNTER — Ambulatory Visit: Payer: Self-pay | Admitting: Dermatology

## 2024-02-06 ENCOUNTER — Telehealth: Payer: Self-pay

## 2024-02-06 LAB — SURGICAL PATHOLOGY

## 2024-02-06 NOTE — Telephone Encounter (Signed)
 Shared result with patient and family by phone. 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 Requires surgery. Risk of pain bleeding infection scar recurrence. Scheduled tomorrow 245. Gave address and phone of clinic. All questions answered

## 2024-02-06 NOTE — Telephone Encounter (Signed)
 [facility] pathology calling with verbal pathology results- left upper paraspinal- melanoma in situ- report will be faxed here today

## 2024-02-07 ENCOUNTER — Ambulatory Visit: Admitting: Dermatology

## 2024-02-07 DIAGNOSIS — D039 Melanoma in situ, unspecified: Secondary | ICD-10-CM

## 2024-02-07 DIAGNOSIS — D0359 Melanoma in situ of other part of trunk: Secondary | ICD-10-CM

## 2024-02-07 MED ORDER — MUPIROCIN 2 % EX OINT: 1.0000 | TOPICAL_OINTMENT | Freq: Every day | CUTANEOUS | 0 refills | Status: AC

## 2024-02-07 NOTE — Telephone Encounter (Signed)
 ERROR

## 2024-02-07 NOTE — Patient Instructions (Signed)

## 2024-02-07 NOTE — Progress Notes (Signed)
" ° °  Follow-Up Visit   Subjective  Kari Murray is a 64 y.o. female who presents for the following: Excision of melanoma in situ  The following portions of the chart were reviewed this encounter and updated as appropriate: medications, allergies, medical history  Review of Systems:  No other skin or systemic complaints except as noted in HPI or Assessment and Plan.  Objective  Well appearing patient in no apparent distress; mood and affect are within normal limits.  A focused examination was performed of the following areas: back Relevant physical exam findings are noted in the Assessment and Plan.    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 - Skin excision  Excision method:  elliptical Lesion length (cm):  1.3 Lesion width (cm):  1.3 Margin per side (cm):  0.5 Total excision diameter (cm):  2.3 Informed consent: discussed and consent obtained   Timeout: patient name, date of birth, surgical site, and procedure verified   Procedure prep:  Patient was prepped and draped in usual sterile fashion Prep type:  Chlorhexidine Anesthesia: the lesion was anesthetized in a standard fashion   Anesthetic:  1% lidocaine w/ epinephrine 1-100,000 buffered w/ 8.4% NaHCO3 Instrument used: #15 blade   Hemostasis achieved with: suture, pressure and electrodesiccation   Outcome: patient tolerated procedure well with no complications    - Skin repair Complexity:  Intermediate Final length (cm):  6 Informed consent: discussed and consent obtained   Timeout: patient name, date of birth, surgical site, and procedure verified   Procedure prep:  Patient was prepped and draped in usual sterile fashion Prep type:  Chlorhexidine Anesthesia: the lesion was anesthetized in a standard fashion   Anesthetic:  1% lidocaine w/ epinephrine 1-100,000 buffered w/ 8.4% NaHCO3 Reason for type of repair: reduce tension  to allow closure, reduce the risk of dehiscence, infection, and necrosis, reduce subcutaneous dead space and avoid a hematoma, allow closure of the large defect and preserve normal anatomy   Undermining: edges could be approximated without difficulty   Subcutaneous layers (deep stitches):  Suture size:  3-0 Suture type: Vicryl (polyglactin 910)   Stitches:  Buried vertical mattress Fine/surface layer approximation (top stitches):  Suture size:  4-0 Suture type: Prolene (polypropylene)   Stitches: simple running   Suture removal (days):  12 Hemostasis achieved with: suture, pressure and electrodesiccation Outcome: patient tolerated procedure well with no complications   Post-procedure details: sterile dressing applied and wound care instructions given   Dressing type: petrolatum, bandage and pressure dressing    Specimen 1 - Surgical pathology Differential Diagnosis: BX proven MELANOMA IN SITU, LENTIGO MALIGNA TYPE   Check Margins: yes [id number]  Superior tag    Return in about 12 days (around 02/19/2024) for Suture Removal and schedule FBSE.  LILLETTE Lonell Drones, RMA, am acting as scribe for Boneta Sharps, MD .   Documentation: I have reviewed the above documentation for accuracy and completeness, and I agree with the above.  Boneta Sharps, MD  "

## 2024-02-09 ENCOUNTER — Other Ambulatory Visit: Payer: Self-pay | Admitting: Family Medicine

## 2024-02-09 DIAGNOSIS — F325 Major depressive disorder, single episode, in full remission: Secondary | ICD-10-CM

## 2024-02-09 LAB — SURGICAL PATHOLOGY

## 2024-02-19 ENCOUNTER — Encounter: Admitting: Dermatology

## 2024-04-22 ENCOUNTER — Ambulatory Visit: Admitting: Family Medicine
